# Patient Record
Sex: Female | Born: 1960 | Hispanic: Yes | Marital: Married
Health system: Western US, Academic
[De-identification: ages and names within clinical notes are randomized; demographics above are authoritative.]

---

## 2017-06-17 ENCOUNTER — Emergency Department
Admission: EM | Admit: 2017-06-17 | Discharge: 2017-06-17 | Disposition: A | Discharge status: Home / Self Care | Payer: Worker's Comp, Other unspecified | Attending: Emergency Medicine | Admitting: Emergency Medicine

## 2017-06-17 ENCOUNTER — Emergency Department (EMERGENCY_DEPARTMENT_HOSPITAL): Payer: Worker's Comp, Other unspecified

## 2017-06-17 DIAGNOSIS — Y93E5 Activity, floor mopping and cleaning: Secondary | ICD-10-CM

## 2017-06-17 DIAGNOSIS — I10 Essential (primary) hypertension: Secondary | ICD-10-CM | POA: Insufficient documentation

## 2017-06-17 DIAGNOSIS — S6992XA Unspecified injury of left wrist, hand and finger(s), initial encounter: Secondary | ICD-10-CM

## 2017-06-17 DIAGNOSIS — Y990 Civilian activity done for income or pay: Secondary | ICD-10-CM | POA: Insufficient documentation

## 2017-06-17 DIAGNOSIS — W231XXA Caught, crushed, jammed, or pinched between stationary objects, initial encounter: Secondary | ICD-10-CM | POA: Insufficient documentation

## 2017-06-17 DIAGNOSIS — M79642 Pain in left hand: Secondary | ICD-10-CM

## 2017-06-17 DIAGNOSIS — E119 Type 2 diabetes mellitus without complications: Secondary | ICD-10-CM | POA: Insufficient documentation

## 2017-06-17 NOTE — ED Notes (Signed)
Awaiting for MD to fill worker's comp form for pt then will discharge pt  MD Dorann LodgeVan Hoesen made aware

## 2017-06-17 NOTE — Discharge Instructions (Signed)
Contusin en un dedo     Finger Contusion     1.  Usted ha sido atendido por una contusin en un dedo (moretn).   1.  You have been seen forfinger contusion (bruise).             2.  Una contusin es un moretn. Una contusin sucede cuando el cuerpo es golpeado o impactado por algo. Esto rompe pequeos vasos sanguneos llamados vasos capilares. Cuando los vasos capilares se rompen, hay sangrado. Esto hace que la piel se vea roja, morada, azul o negra. El rea lesionada puede doler por unos cuantos das. Si est tomando un anticoagulante (por ejemplo, Coumadin o warfarina) el moretn puede empeorar.   2.  A contusion is a bruise. A contusion happens when something strikes or hits the body. This breaks small blood vessels called capillaries. When the capillaries break, blood leaks out. This makes the skin look red, purple, blue, or black. The injured area may hurt for a few days. If you take a blood thinner (like Coumadin or warfarin) the bruising may be worse.             3.  Estas lesiones pueden causar dolor e hinchazn, y el dedo puede ponerse descolorido. Su evaluacin de hoy muestra que quizs no tenga huesos rotos ni dislocados. Puede esperar a que sus sntomas mejoren durante los siguientes 7 das.   3.  These injuries can cause pain and swelling, and the finger may get discolored. Your evaluation today shows that you probably don t have a broken or dislocated bone. You can expect your symptoms to get better over the next7 days.             4.  Algunas cosas que puede hacer para ayudar a su lesin son: reposo, aplicar hielo, aplicar compresiones y elevar la zona lesionada. Todo se resume a reposo, hielo, compresin y elevacin.   4.  Some things you can do to help your injury are: Resting, Icing, Compressing and Elevating the injured area. Remember this as "RICE."        * REPOSO: limite el movimiento de la parte lesionada del cuerpo.     * REST: Limit the  use of the injured body part.         * HIELO: al aplicar hielo en el rea afectada, se pueden reducir el dolor y la hinchazn. Coloque cubitos de hielo con un poco de agua en una bolsita hermtica (tipo Ziploc). Ponga una toallita entre la bolsa y su piel. Aplique esta bolsa de hielo sobre el rea al menos por 20 minutos. Haga esto al menos 4 veces al da. Est bien si lo hace con ms frecuencia de la indicada. Tambin lo puede hacer por ms tiempo del indicado. NUNCA APLIQUE HIELO DIRECTAMENTE SOBRE LA PIEL.     * ICE: By applying ice to the affected area, swelling and pain can be reduced. Place some ice cubes in a re-sealable (Ziploc) bag and add some water. Put a thin washcloth between the bag and the skin. Apply the ice bag to the area for at least 20 minutes. Do this at least 4 times per day. It is okay to do this more often than directed. You can also do it for longer than directed. NEVER APPLY ICE DIRECTLY TO THE SKIN.         * COMPRESIN: compresin significa aplicar presin sobre la zona lesionada, como con un entablillado, un yeso o una venda elstica. La   compresin disminuye la hinchazn y le brinda comodidad. La compresin debera ser la suficiente para aliviar la hinchazn, pero no debe estar tan apretada que reduzca la circulacin. Un dolor en aumento, entumecimiento, hormigueo o cambios en el color de la piel son sntomas de disminucin de la circulacin.    * COMPRESS: Compression means to apply pressure around the injured area such as with a splint, cast or an ace bandage. Compression decreases swelling and improves comfort. Compression should be tight enough to relieve swelling but not so tight as to decrease circulation. Increasing pain, numbness, tingling, or change in skin color, are all signs of decreased circulation.        * ELEVACIN: eleve la zona lesionada.    * ELEVATE: Elevate the injured part.             5.  Su mdico puede recetarle analgsicos para su  dolor. Tambin puede usar medicamentos de venta libre como acetaminofeno (Tylenol), ibuprofeno (Advil o Motrin) o naproxeno (Aleve, Naprosyn. Es importante seguir las instrucciones para tomar estos medicamentos.   5.  Your doctor may prescribe you pain medications for yourpain. You can also use over-the-counter medicines like acetaminophen (Tylenol), ibuprofen (Advil or Motrin) or naproxen (Aleve, Naprosyn). It is important to follow the directions for taking these medications.             6.  DEBE BUSCAR ATENCIN MDICA INMEDIATAMENTE, AQU O EN LA SALA DE EMERGENCIAS MS CERCANA, SI SE PRESENTA CUALQUIERA DE LAS SIGUIENTES SITUACIONES:   6.  YOU SHOULD SEEK MEDICAL ATTENTION IMMEDIATELY, EITHER HERE OR AT THE NEAREST EMERGENCY DEPARTMENT, IF ANY OF THE FOLLOWING OCCUR:      * Si sus sntomas no han comenzado a desaparecer en 5-10 das.    * Your symptoms haven t started to get better in 5-10 days.      * Si el dedo est notablemente deforme despus de bajar la hinchazn, especialmente si no se le tomaron radiografas durante su visita. En ocasiones, una pequea fractura no puede ser vista fcilmente con las primeras radiografas.    * The finger is noticeably misshapen after the swelling gets better, especially if you didn t get x-rays during your visit. Sometimes a small fracture can t be seen easily with the first x-ray.      * Si empieza a tener dolor fuerte en el dedo afectado, o si ste se pone plido, entumecido y muy firme al tacto.    * You start to have severe pain in the affected finger, or thefinger becomes pale, numb, and very firm to the touch.      * Si pierde sensibilidad en su dedo o no puede moverlo.    * You lose feeling in your finger or can t move it.             7.  Si no puede dar seguimiento con su mdico, o si en cualquier momento cree que necesita una nueva revisin o ser atendido de nuevo, venga aqu o acuda a la sala de emergencias ms  cercana.   7.  If you can t follow up with your doctor, or if at any time you feel you need to be rechecked or seen again, come back here or go to the nearest emergency department.

## 2017-06-17 NOTE — ED Provider Notes (Signed)
Emergency Department Provider Note  The Date of Service for the Emergency Room encounter is 06/17/2017  7:09 PM   Patient: Natalie Briggs, MRN [id number], DOB 1960/08/20  Chief Complaint   Patient presents with    Hand Injury - Minor     c/o injury to left hand first digit while opening a door. Swelling, difficulty moving finger. Work related injury.       HPI: Natalie Briggs is a 57 year old female who PMH of diabetes and hypertension who presents today with left hand 1st finger (index) injury that took place today. 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 Finger immediately reported pain and swelling. Pulsating/stinging pain without numbness or tingling. Limited flexion and extension.  Did not take any pain medications for her symptoms. Takes Metformin and unknown hypertensive medication for chronic medications. Patient is right hand dominant.       Home Medications:  None       Allergies:Patient has no known allergies.    Past Medical & Surgical History:No past medical history on file.No past surgical history on file.    Family History:No family history on file.    Social History:  Social History     Tobacco Use    Smoking status: Not on file   Substance Use Topics    Alcohol use: Not on file    Drug use: Not on file       ROS: Complete review of 12 systems performed and all negative except as noted in HPI  Patient's medical history has been reviewed today as available in EPIC chart.   Primary MD: No primary care provider on file.    Physical Exam  Vitals:    06/17/17 1850 06/17/17 1939   BP: 161/69    Pulse: 68    Resp: 16    Temp: 98.3 F (36.8 C)    SpO2: 98% 100%   Weight: 71.4 kg (157 lb 8 oz)      Vital signs reviewed and noted. O2 sat interpreted as normal.   Exam  Vitals signs noted and reviewed.  GENERAL APPEARANCE: Well appearing, A&O x 3  HEENT: no trauma.  PERRL, mucous membranes moist    NECK:   No midline cervical  spine ttp, no meningeal signs  CARDIAC:  RRR, no murmurs appreciated  CHEST : no deformity or TTP  LUNGS: CTAB with no W/R/R.  Good effort   ABDOMEN: Soft, no TTP, no distention.   BACK:  No flank tenderness, no midline t or l spine ttp   EXTREMITIES: No significant deformity or joint abnormality. No edema.  Left Hand.  Hand exam: soft tissue tenderness and swelling at the 1st finger (index) MCP, reduced range of motion, scaphoid (snuffbox) tenderness absent, SILT +2 point discrimination. Rad/ulnar pulse 2+. All other digits have FROM.   NEUROLOGICAL: CN II-XII grossly intact. Moving all extremities, following commands appropriately, gait normal  SKIN:    Warm and dry  PSYCHIATRIC: A&Ox3.  Normal affect.    Medical Decision Making & Plan  Pt is a 57 year old yo female with history as above who presents with left index finger injury after door was jammed into finger.  Reports pulsating and stinging pain. Limited ROM with trauma to finger suspect for possible sprain, contusion, fracture. No neurovascular compromise.  Order X-ray and pain control.      Initial Differential Diagnosis: sprain, contusion, fracture    Plan: Diagnostic  testing and symptomatic treatment as below. Monitor and reassess.   Orders Placed This Encounter   Procedures    X-Ray Hand Minimum 3 Views - Left     Medications - No data to display  Results  X-ray Hand Minimum 3 Views - Left  Result Date: 06/17/2017  IMPRESSION: Normal examination of the left hand.    Interpretation of Results:   Negative for fractures. Normal left hand.     ED Course:  Pain control: Ibuprofen  X-Ray Left Hand   Patient reassessed after above interventions. Vitals stable. Symptoms improved. . No new complaints.    Impression:    Based on workup so far, presentation most consistent with sprain of left index finger. Use of finger splint and pain control with ibuprofen.  Imaging negative for fracture.     Post splint remains neurovascularly intact    At this time I have  low suspicion for concerning etiology of symptoms requiring further emergent intervention. Patient is appropriate for outpatient management.    ICD-10-CM ICD-9-CM    1. Finger injury, left, initial encounter S69.92XA 959.5        Dispo: Discharge home   Discussed results, return precautions, and follow up plan with patient who expressed understanding.    Plan to follow up with PMD   Referrals and prescriptions as above.    Seen and examined with Madelyn FlavorsAmanda Ritu Gagliardo, NP. -Deloris PingKatherine Curran NP Student     Patient seen and discussed with ED attending Dr. Beacher MayVan Hoesen   Leevon Upperman V Jamielee Mchale, NP   Emergency Medicine             Emily FilbertNasser, Alyrica Thurow V, NP  06/17/17 1958       Dorann LodgeVan Hoesen, Scheryl MartenKaren Beth, MD  06/18/17 [phone]

## 2017-06-17 NOTE — ED Notes (Signed)
Pt provided with discharge and follow up instructions.   Verbalizes understanding, no further questions.  All belongings with pt, ambulates with steady gait, a/ox4, NAD noted.   Accompanied by spouse.  [facility] spanish interpreter # [phone] used during discharge process.

## 2017-06-17 NOTE — ED Notes (Signed)
Narrative     EXAM DESCRIPTION:  X-RAY HAND MINIMUM 3 VIEWS - LEFT    CLINICAL HISTORY:  Injury    TECHNIQUE:  3 images    COMPARISON:  None    FINDINGS:  There is no acute fracture or malalignment. The joint spaces are preserved.    The soft tissues are unremarkable.          Signed by: Aletta EdouardGentili, Amilcare 06/17/2017 19:29:12   Impression     IMPRESSION:  Normal examination of the left hand.

## 2017-06-17 NOTE — ED Notes (Addendum)
First contact with pt, introduced self and assumed care at this time.  Pt c/o work-related injury at noon today, states "working with a co-worker, I went out and I tried to hold on the door and my finger got stuck on the door".   Has numbness and pulsating sensation on the left hand index finger  Denies taking any pain medication for it  Pt A & Ox4, appears to be well-appearing.  Speaking full, clear sentences.  Breathing even and unlabored on RA.  Pt's skin is warm, dry, intact    Hx: DM2, HTN  States takes medications for her HTN and DM, prescribed by [place]CP from [place]  Denies any allergies    NP at the bedside assessing patient  Darnelle SpangleUtilized Spanish interpreter services # [phone]

## 2019-07-01 ENCOUNTER — Encounter (INDEPENDENT_AMBULATORY_CARE_PROVIDER_SITE_OTHER): Payer: Self-pay

## 2023-09-24 IMAGING — MR JOELHOS
1 series · 9 of 9 positions shown · non-contrast
Comparison: none

------------- REPORT [id number] -------------
Patient:GELOVANI PIOTAR
Referring Physician: [facility]
TECHNIQUE: Exam performed with T1, T2 and T2 weighted sequences with fat suppression.
Report:
Radial tear affecting the inner margin of the transition of the body and posterior horn of the medial meniscus. Posterior root of the medial meniscus is thinned and with intrasubstantial signal alteration, which may represent tear extrusion of the body of the medial meniscus.
Mild diffuse degeneration of the lateral meniscus, with intrasubstantial signal alteration, without tears.
Thinning and irregularity of the chondral lining on the weight-bearing surfaces of the medial femorotibial compartment, without signal changes in the subchondral bone.
MRI OF THE RIGHT KNEE
Slight thinning with irregularity of the chondral lining on the weight-bearing surface of the lateral femoral condyle.
Erosion of the lining with the inferior aspect of the lateral facet of the patella, without cysts or subchondral bone edema.
Erosion of the chondral lining in the lower portion of the lateral facet of the femoral trochlea, with cystic edematous changes.
Cruciate and collateral ligaments with usual orientation, preserved thickness and signal.
Remaining bone structures with preserved morphology and medullary signal.
Enthesopathy at the origin of the medial head of the gastrocnemius and insertional tendinopathy of the semimembranosus.
Quadriceps, distal biceps femoris, iliotibial tract and pes anserinus tendons unremarkable.
Patellar ligament unremarkable.
Absence of joint effusion.
Popliteal fossa without cystic formations.
Sparse varicose veins.
TECHNIQUE: Examination performed with sequences weighted in T1, T2 and T2 with fat suppression.
Thinning of the posterior root of the medial meniscus with intrasubstance signal alteration. Degenerative changes of the body and posterior horn of the medial meniscus, with thinning of the free margin and intrasubstance signal alteration.
Partial radial tear on the inner margin of the body of the lateral meniscus with a meniscal flap adjacent to the upper portion of the body.
MRI OF THE LEFT KNEE
Thinning and irregularity of the chondral lining on the weight-bearing surfaces of the femorotibial compartments, without signal changes in the subchondral bone.
Diffuse irregularities of the patellar chondral lining with focal thinning on the lateral facet, without cysts or subchondral bone edema.
Erosion of the chondral lining of the femoral trochlea in the fundus and medial facet, without cysts or subchondral bone edema.
Slight thickening and intrasubstance signal alteration of the anterior cruciate ligament, suggesting myxoid degeneration.
Posterior cruciate and collateral ligaments with habitual orientation, conserved thickness and signal.
Remaining bone structures with conserved morphology and medullary signal.
Tendinopathy at the origin of the medial head of the gastrocnemius and at the insertion of the semimembranosus.
Quadriceps, distal biceps femoris, iliotibial tract and pes anserinus tendons without particularities.
Patellar ligament without particularities.
Patient: ABOU EL FETAR

[Series 16: localizer_sag+cor+tra · right · 2 acquisitions, 9 frames shown]
[im 1/2]
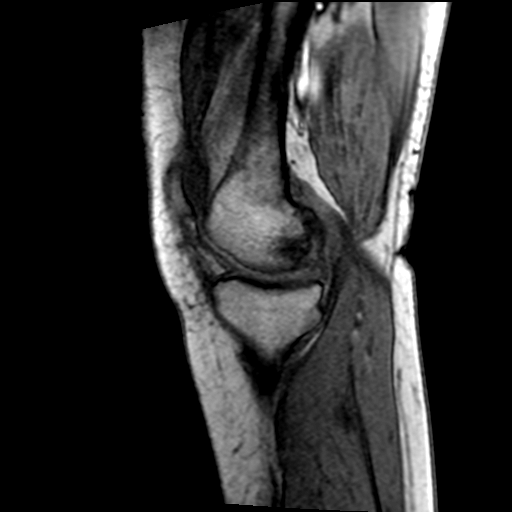
[im 1/2]
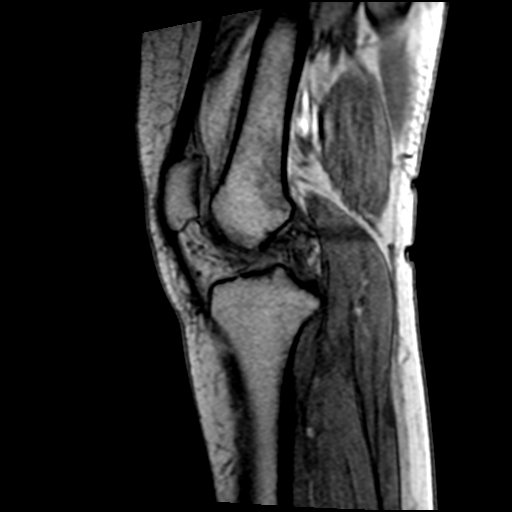
[im 1/2]
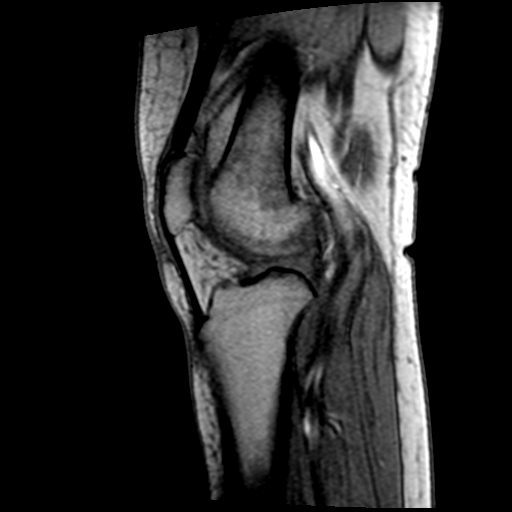
[im 1/2]
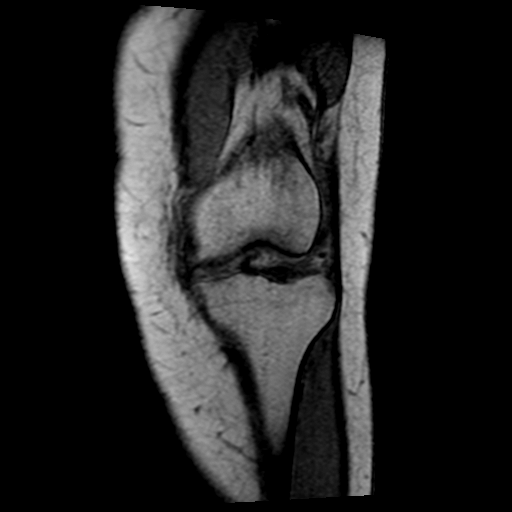
[im 1/2]
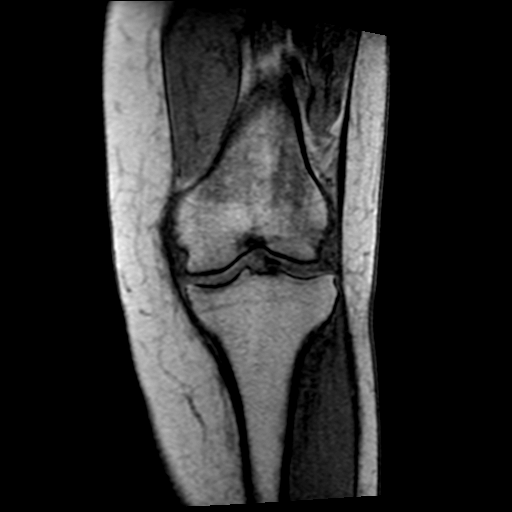
[im 1/2]
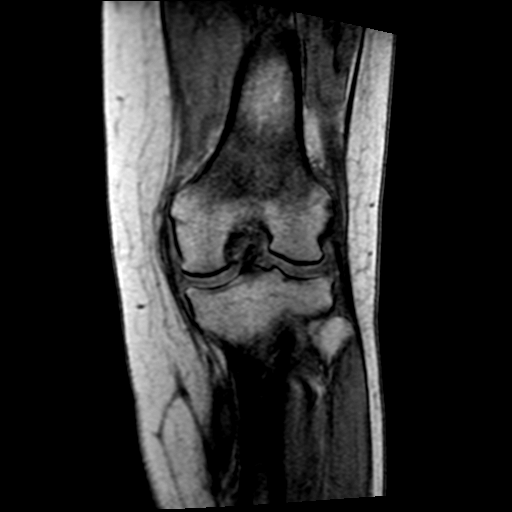
[im 2/2]
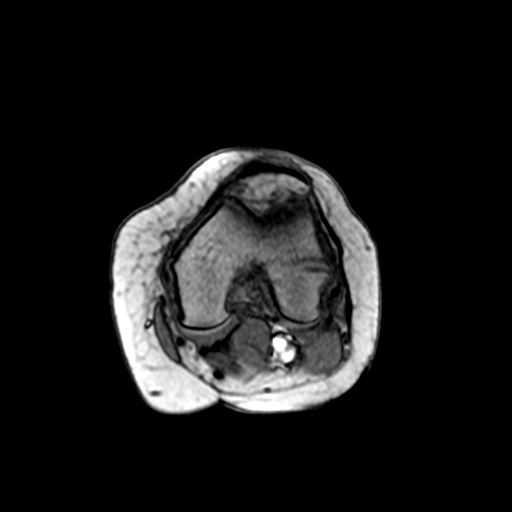
[im 2/2]
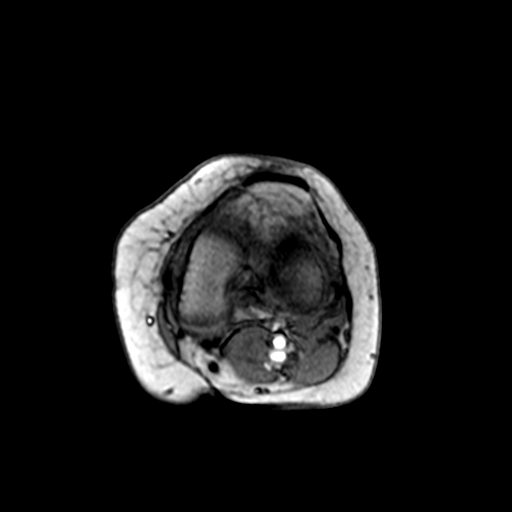
[im 2/2]
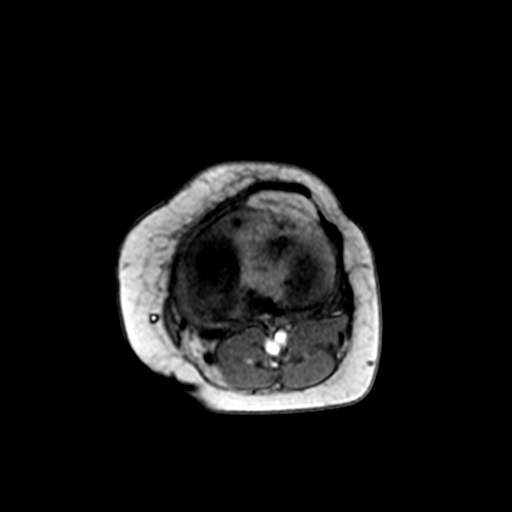

[9 of 9 positions shown; findings below may reference images not displayed]

IMPRESSION: Radial tear affecting the inner margin of the transition of the body and posterior horn of the medial meniscus.
Signs of imminent tear of the posterior root of the medial meniscus.
Diffuse degenerative changes in the lateral meniscus, without evident tears.
Chondropathy on the weight-bearing surfaces of the medial femorotibial compartment and the lateral femoral condyle, without cysts or subchondral bone edema.
Femoropatellar chondropathy with cystic edematous changes in the femoral trochlea.
No joint effusion.


------------- REPORT [id number] -------------
Patient: ABOU EL FETAR
Referring Physician: [facility]
IMPRESSION: Thinning of the posterior root of the medial meniscus with intrasubstance signal alteration, suggesting impending rupture.
Partial radial tear on the inner margin of the body of the lateral meniscus.
Chondropathy on the weight-bearing surfaces of the femorotibial compartments and in the femoropatellar compartment, without cysts or subchondral bone edema.
No joint effusion.
